# Patient Record
Sex: Female | Born: 1985 | Race: White | Hispanic: Yes | Marital: Single | Smoking: Never smoker
Health system: Southern US, Community
[De-identification: ages and names within clinical notes are randomized; demographics above are authoritative.]

## PROBLEM LIST (undated history)

## (undated) DIAGNOSIS — D649 Anemia, unspecified: Secondary | ICD-10-CM

## (undated) DIAGNOSIS — R87612 Low grade squamous intraepithelial lesion on cytologic smear of cervix (LGSIL): Secondary | ICD-10-CM

---
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
## 2004-04-13 ENCOUNTER — Ambulatory Visit: Admission: RE | Admit: 2004-04-13 | Discharge: 2004-04-13 | Payer: Self-pay | Admitting: Family Medicine

## 2004-04-13 ENCOUNTER — Emergency Department (HOSPITAL_COMMUNITY): Admission: EM | Admit: 2004-04-13 | Discharge: 2004-04-13 | Payer: Self-pay | Admitting: Emergency Medicine

## 2004-06-13 ENCOUNTER — Ambulatory Visit (HOSPITAL_COMMUNITY): Admission: RE | Admit: 2004-06-13 | Discharge: 2004-06-13 | Payer: Self-pay | Admitting: *Deleted

## 2004-11-12 ENCOUNTER — Ambulatory Visit: Payer: Self-pay | Admitting: *Deleted

## 2004-11-12 ENCOUNTER — Inpatient Hospital Stay (HOSPITAL_COMMUNITY): Admission: AD | Admit: 2004-11-12 | Discharge: 2004-11-14 | Payer: Self-pay | Admitting: *Deleted

## 2006-06-21 IMAGING — US US OB TRANSVAGINAL MODIFY
1 series · 14 of 28 positions shown · non-contrast
Comparison: none

CLINICAL DATA: Pain and bleeding x 1 week.  Positive pregnancy test.
 TRANSABDOMINAL AND TRANSVAGINAL PELVIC OB ULTRASOUND <14 WEEKS ? 04/13/04: 
 There is an intrauterine gestational sac present with a crown-rump length of 31.4 millimeters consistent with a 10 week 0 day gestation.  A regular fetal heart rate of 178 beats per minute is noted.  There is a small to moderate subchorionic hemorrhage present.  The ovaries are normal in size with a 2.2 cm in size hypoechoic area seen associated with the right ovary most likely representing a mildly complicated cyst (most likely a corpus luteum cyst).  There is no free pelvic fluid.

[Series 1: unknown · 0.32mm/px · 14 of 76 slices shown]
[im 3/76]
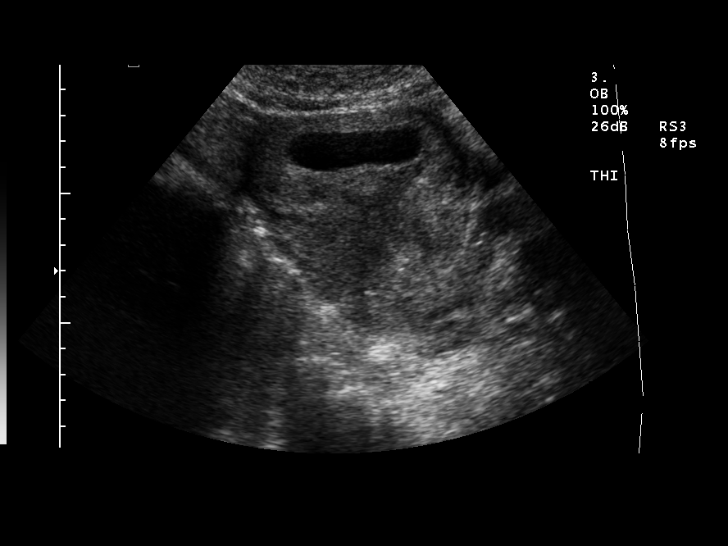
[im 9/76]
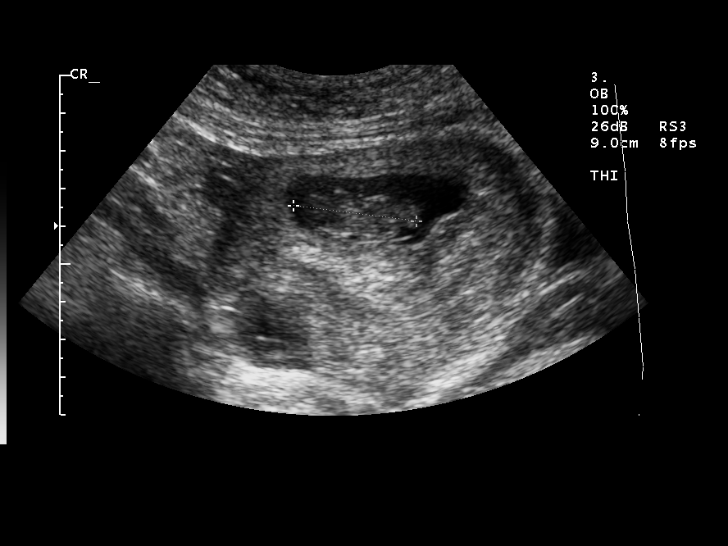
[im 14/76]
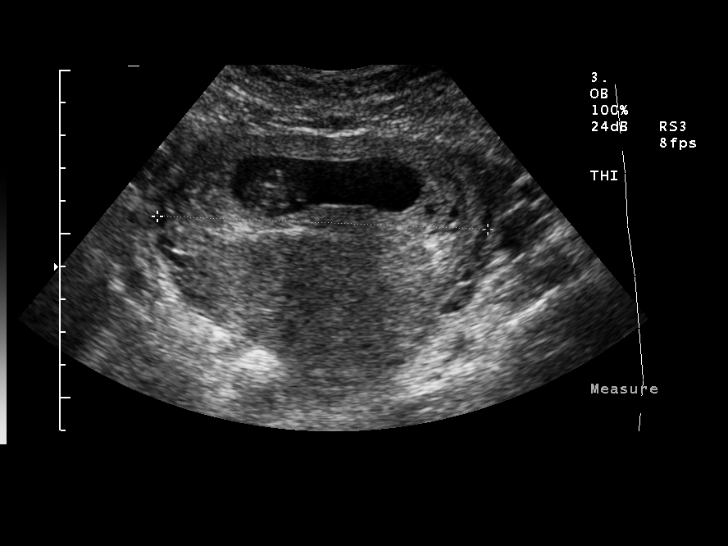
[im 20/76]
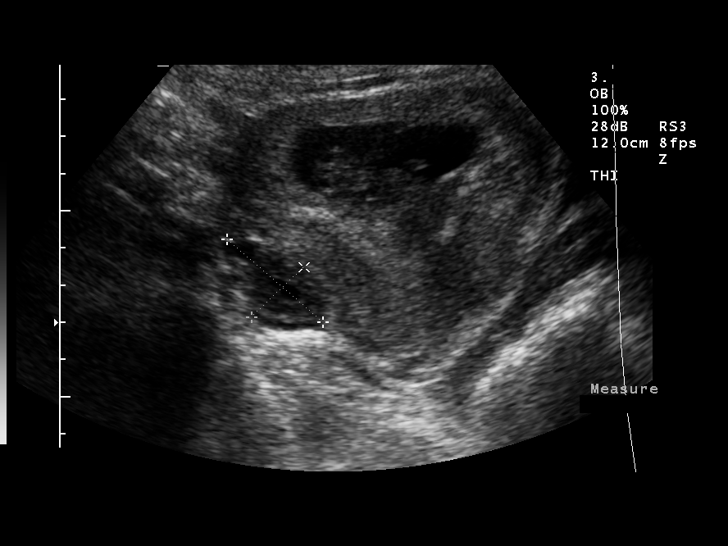
[im 26/76]
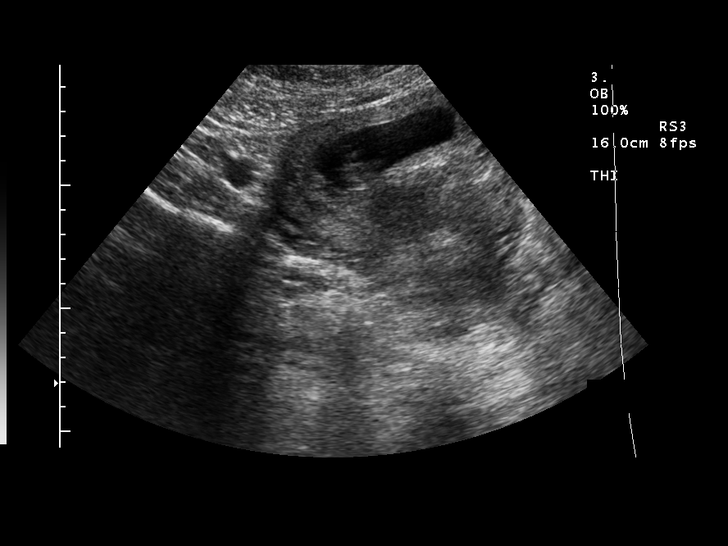
[im 31/76]
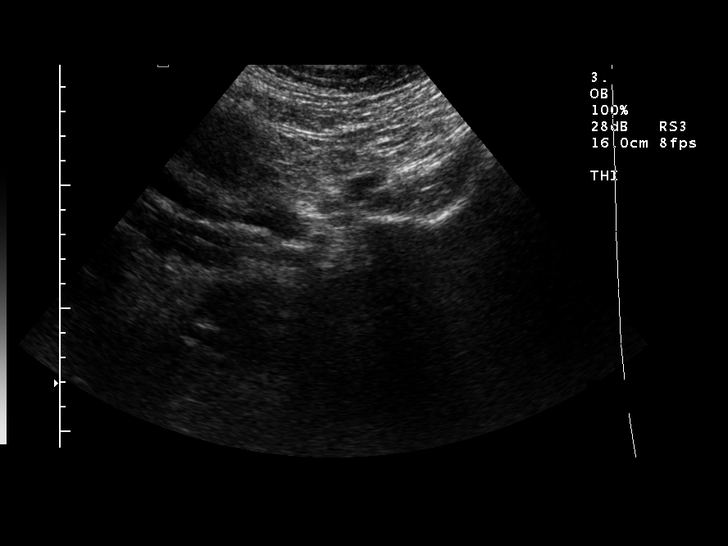
[im 37/76]
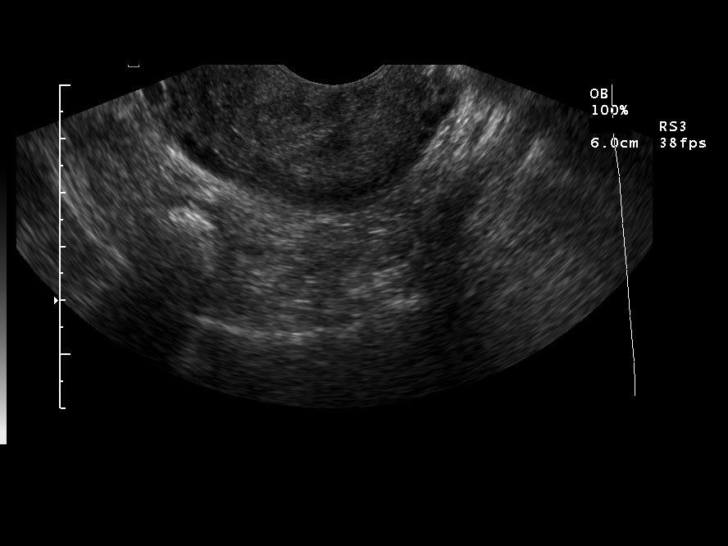
[im 42/76]
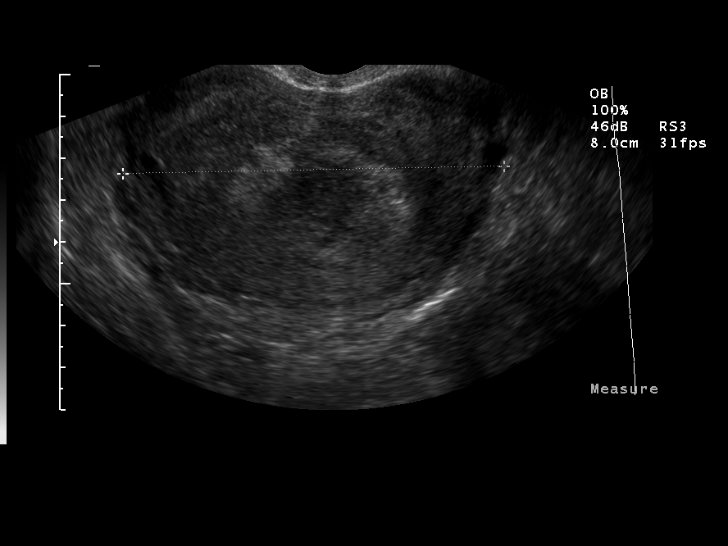
[im 48/76]
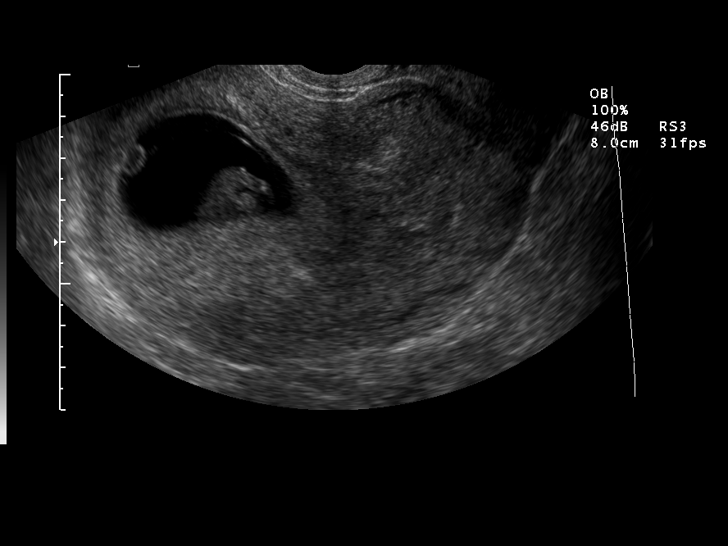
[im 53/76]
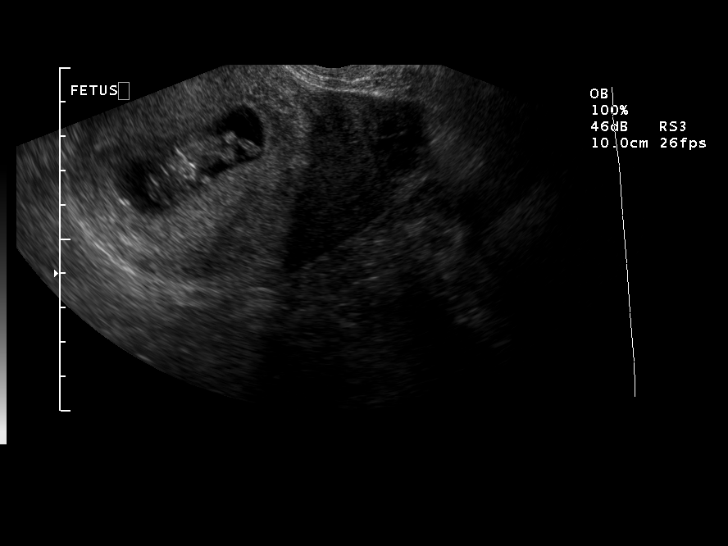
[im 59/76]
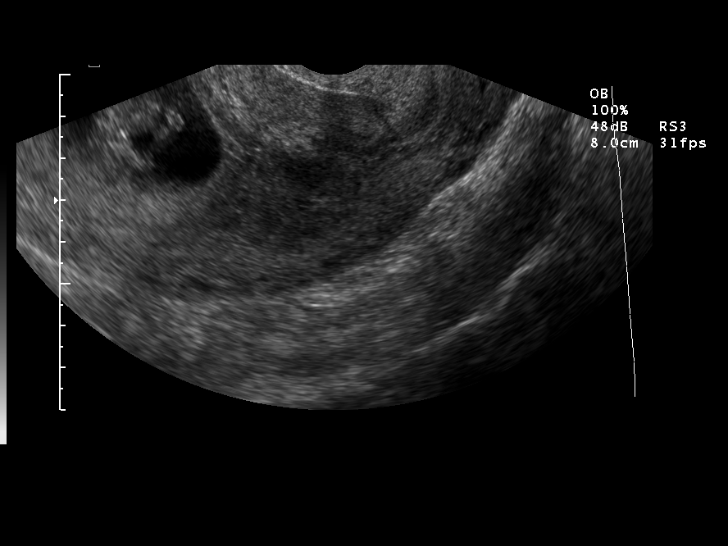
[im 64/76]
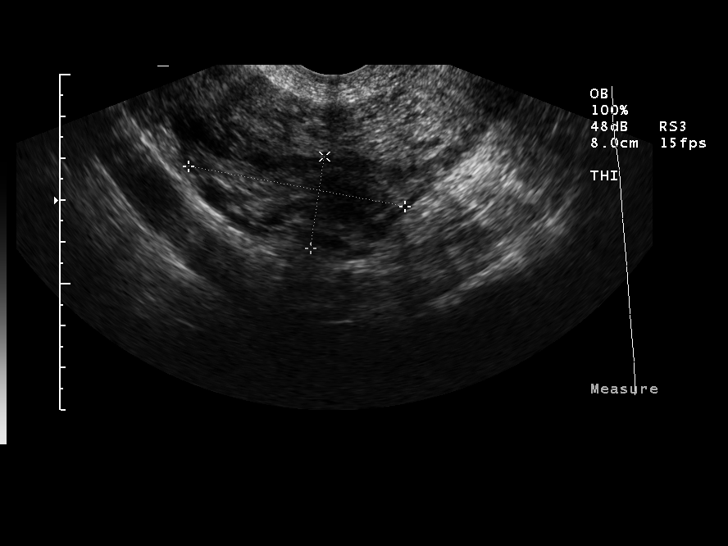
[im 70/76]
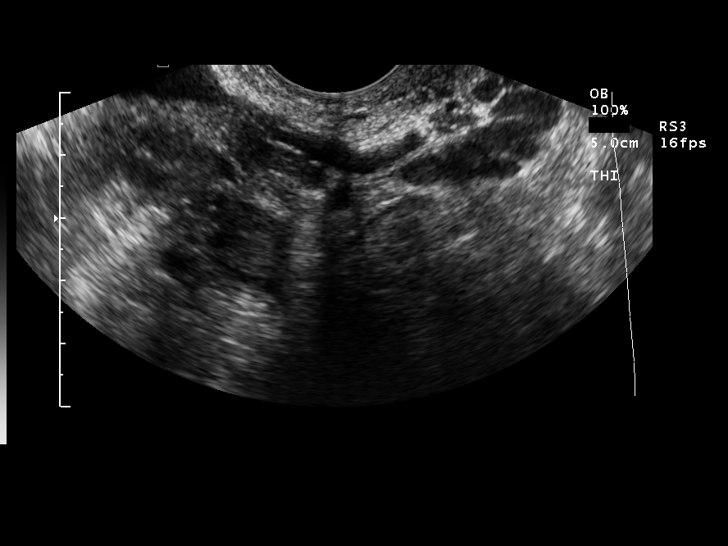
[im 76/76]
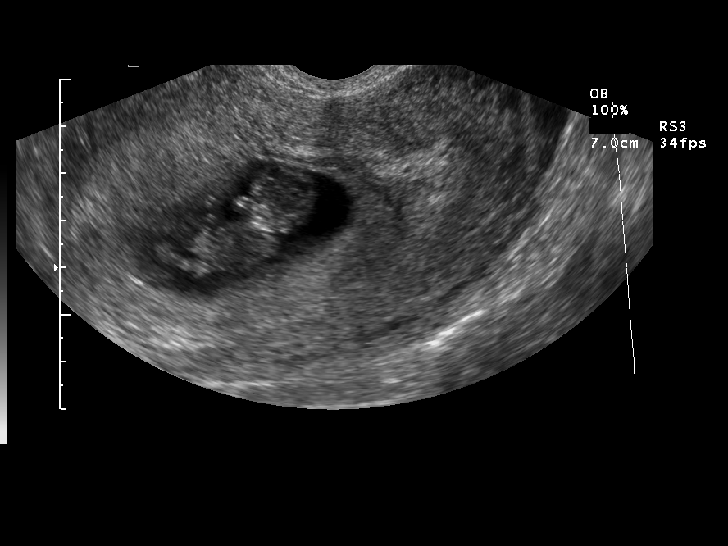

[14 of 28 positions shown; findings below may reference images not displayed]

IMPRESSION: There is a single living intrauterine gestation with a crown-rump length of 31.4 millimeters consistent with a 10 week 0 day gestation.  
 Small to moderate amount of subchorionic hemorrhage is noted.

## 2006-08-21 IMAGING — US US OB COMP +14 WK
1 series · 13 of 28 positions shown · non-contrast
Comparison: 04/13/04.

CLINICAL DATA: Evaluate anatomy.

 OBSTETRICAL ULTRASOUND:

[Series 1: us ob comp +14 wk · 0.12mm/px · 13 of 75 slices shown]
[im 3/75]
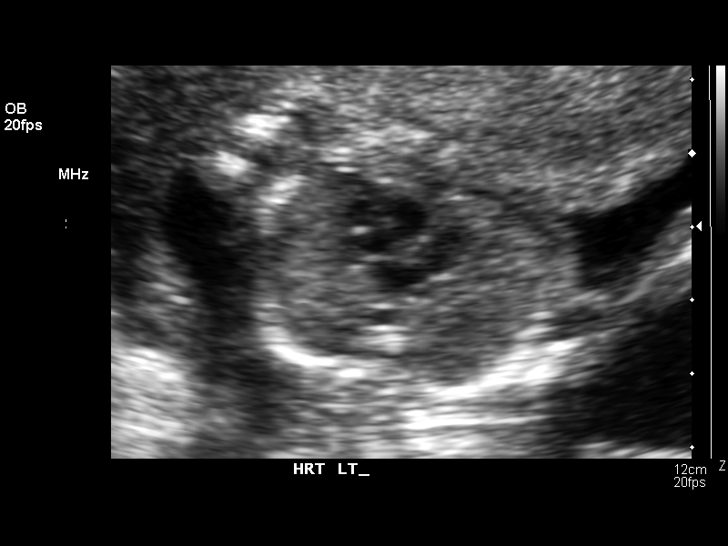
[im 9/75]
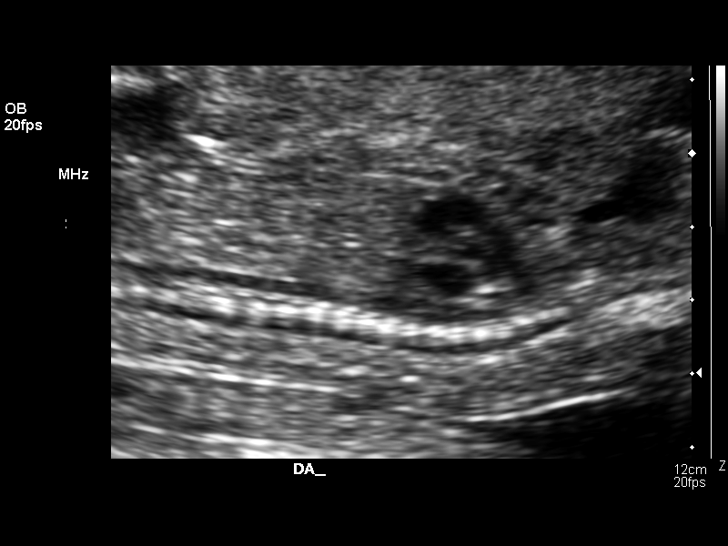
[im 14/75]
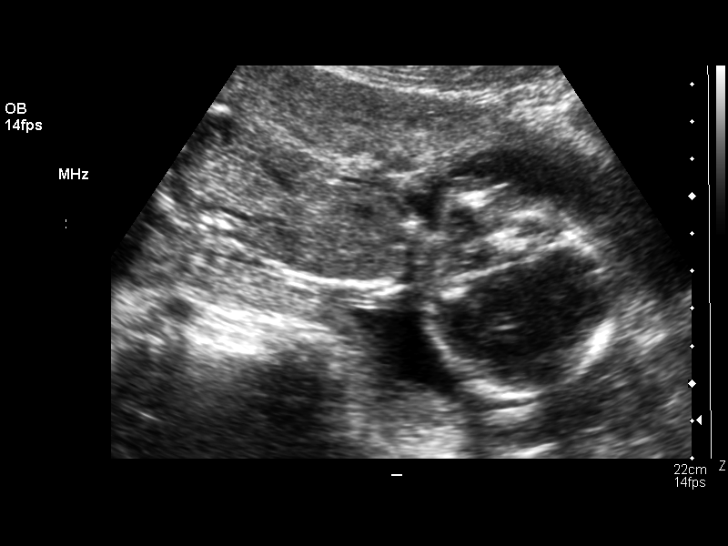
[im 20/75]
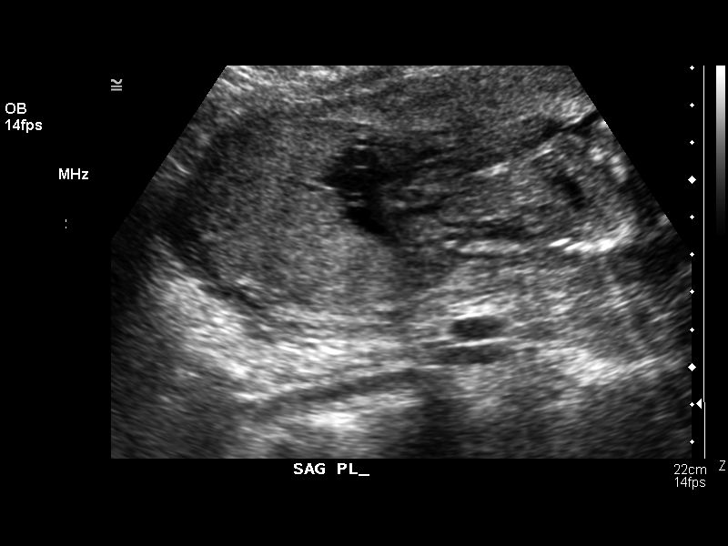
[im 25/75]
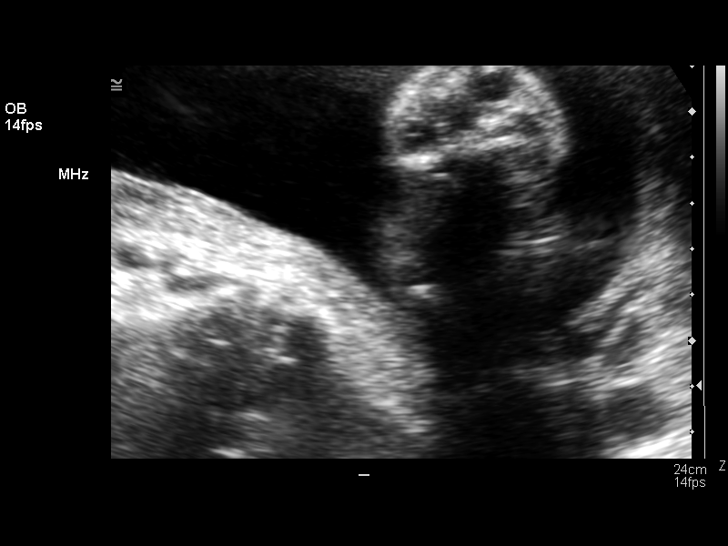
[im 31/75]
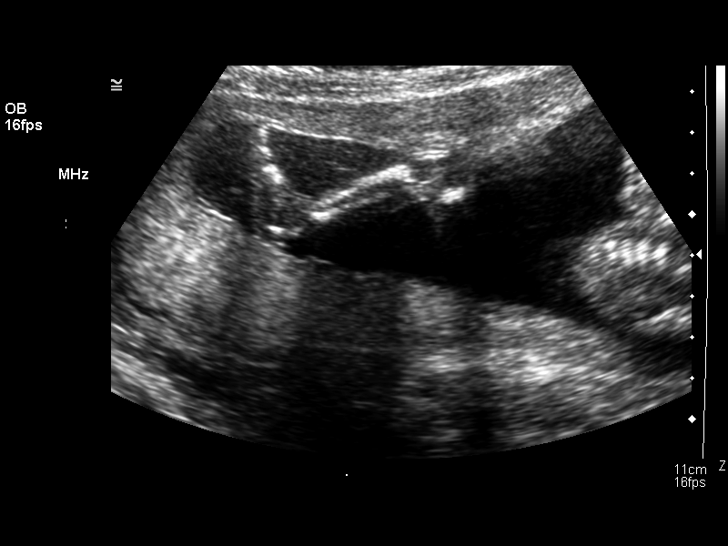
[im 39/75]
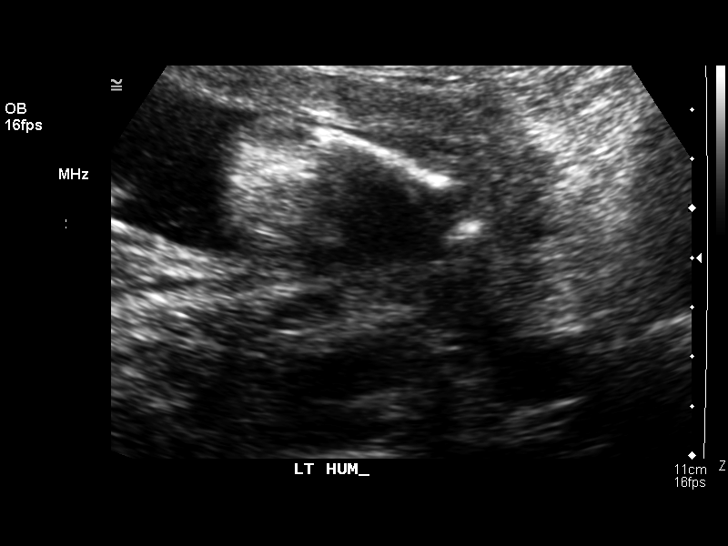
[im 44/75]
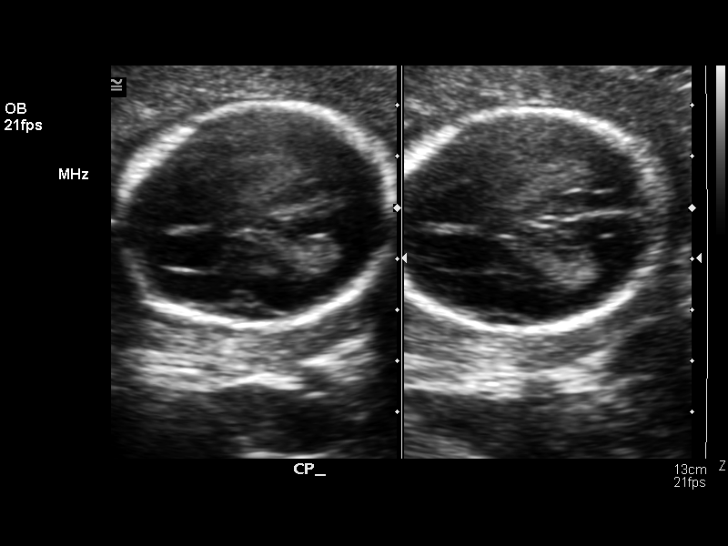
[im 50/75]
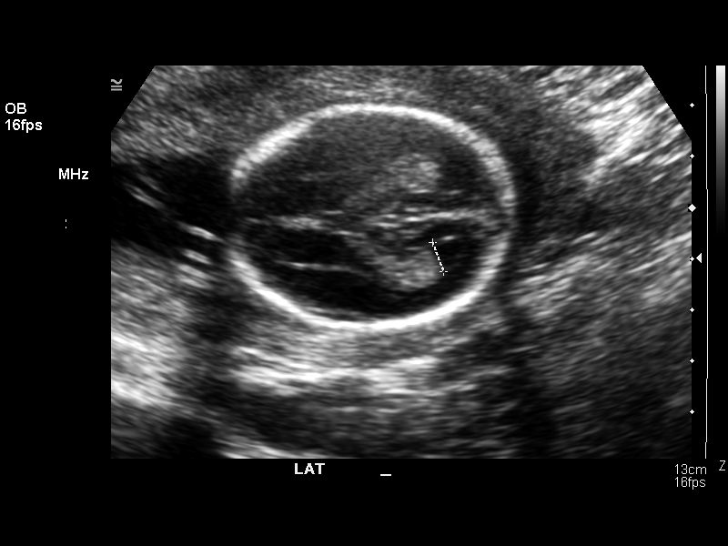
[im 55/75]
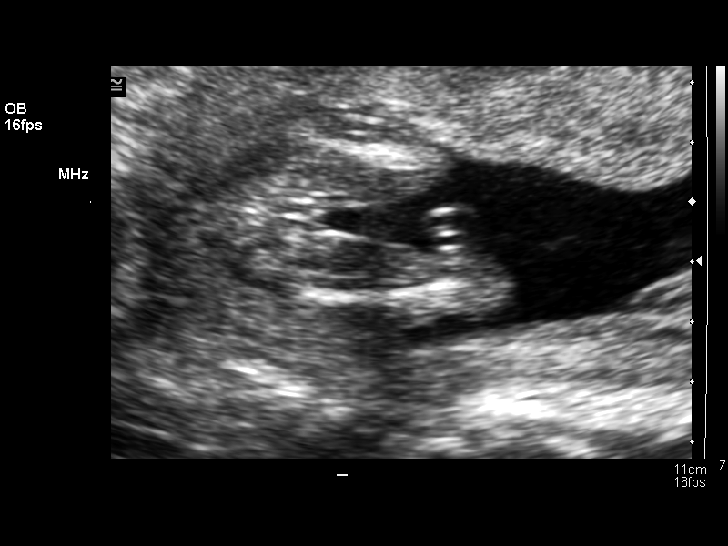
[im 61/75]
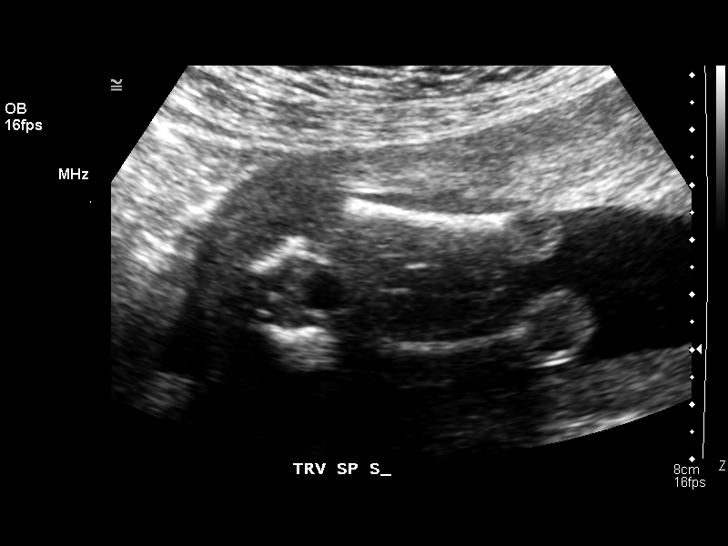
[im 66/75]
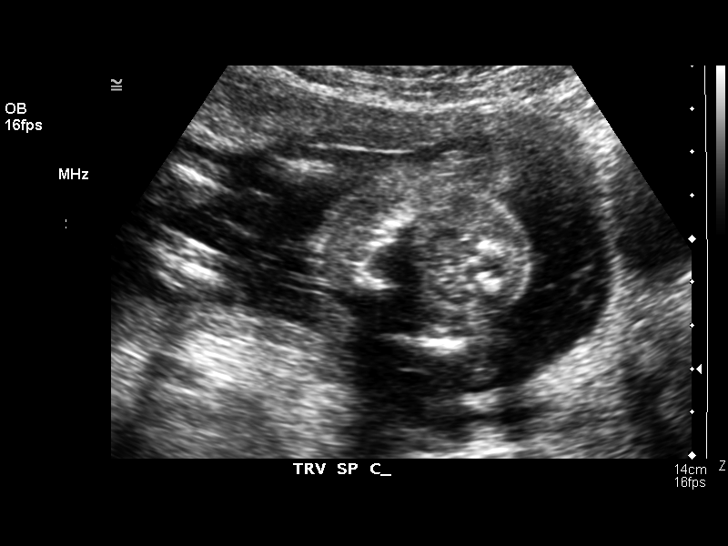
[im 72/75]
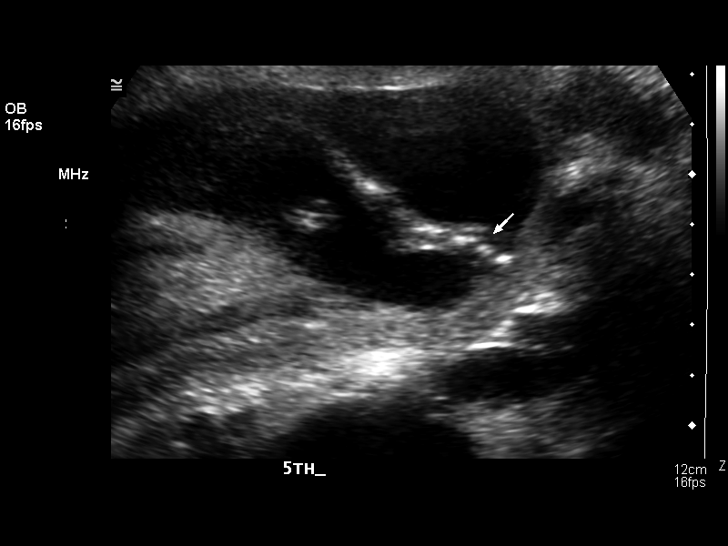

[13 of 28 positions shown; findings below may reference images not displayed]

Number of Fetuses: 1
 Heart Rate:  not recorded
 Movement: yes
 Breathing:  no       
 Presentation: cephalic
 Placental Location: fundal and posterior
 Grade: I
 Previa: no
 Amniotic Fluid (Subjective):  normal
 Amniotic Fluid (Objective): 4.4 cm vertical pocket

 FETAL BIOMETRY
 BPD:  4.3 cm ? 18 w 6 d   
 HC: 15.9 cm ? 18 w 6 d   
 AC: 14.0 cm ? 19 w 3 d  
 FL: 2.9 cm ? 18 w 6 d 

 MEAN GA:  19 w 0 d 

 FETAL ANATOMY
 Lateral Ventricles: visualized   
 Thalami/CSP: visualized     
 Posterior Fossa: visualized   
 Nuchal Region: visualized   
 Spine:  not visualized     
 4 Chamber Heart on Left:  visualized     
 Stomach on Left: visualized     
 3 Vessel Cord: visualized   
 Cord Insertion site: visualized   
 Kidneys: visualized   
 Bladder: visualized   
 Extremities: visualized     

 ADDITIONAL ANATOMY VISUALIZED: LVOT, RVOT, upper lip, orbits, diaphragm, heel, 5th digit, ductal arch, and aortic arch.

 Evaluation limited by: fetal position

 MATERNAL UTERINE AND ADNEXAL FINDINGS
 Cervix: 3.7 cm transabdominally
IMPRESSION: 1.  Single living intrauterine fetus in cephalic presentation with subjectively normal amniotic fluid volume. Interval growth has been appropriate with an estimated mean gestational age of 19 weeks 0 days by ultrasound on today?s study.
 2.  The visualized fetal anatomy is unremarkable although complete imaging of the spine could not be obtained secondary to fetal position.  The posterior fossa does have normal imaging characteristics.  If indicated, follow-up ultrasound in 3 to 4 weeks could be performed to reassess the fetal spine and assessment of fetal growth could be reassessed at that time as well.

 </u12:p>

## 2011-09-11 ENCOUNTER — Other Ambulatory Visit: Payer: Self-pay

## 2011-09-11 ENCOUNTER — Emergency Department (HOSPITAL_COMMUNITY)
Admission: EM | Admit: 2011-09-11 | Discharge: 2011-09-12 | Disposition: A | Discharge status: Home / Self Care | Payer: Self-pay | Attending: Emergency Medicine | Admitting: Emergency Medicine

## 2011-09-11 ENCOUNTER — Emergency Department (HOSPITAL_COMMUNITY): Payer: Self-pay

## 2011-09-11 ENCOUNTER — Encounter (HOSPITAL_COMMUNITY): Payer: Self-pay | Admitting: *Deleted

## 2011-09-11 DIAGNOSIS — J189 Pneumonia, unspecified organism: Secondary | ICD-10-CM | POA: Insufficient documentation

## 2011-09-11 DIAGNOSIS — R0602 Shortness of breath: Secondary | ICD-10-CM | POA: Insufficient documentation

## 2011-09-11 DIAGNOSIS — R079 Chest pain, unspecified: Secondary | ICD-10-CM | POA: Insufficient documentation

## 2011-09-11 NOTE — ED Notes (Signed)
She has had a cold for 2 days with coughing chest pain and some difficulty breathing.  No distress at present

## 2011-09-12 MED ORDER — ALBUTEROL SULFATE (5 MG/ML) 0.5% IN NEBU
5.0000 mg | INHALATION_SOLUTION | Freq: Once | RESPIRATORY_TRACT | Status: AC
  Administered 2011-09-12: 5 mg via RESPIRATORY_TRACT
  Filled 2011-09-12: qty 0.5

## 2011-09-12 MED ORDER — ACETAMINOPHEN-CODEINE 120-12 MG/5ML PO SOLN
10.0000 mL | Freq: Once | ORAL | Status: AC
  Administered 2011-09-12: 10 mL via ORAL
  Filled 2011-09-12: qty 10

## 2011-09-12 MED ORDER — IPRATROPIUM BROMIDE 0.02 % IN SOLN
0.5000 mg | Freq: Once | RESPIRATORY_TRACT | Status: AC
  Administered 2011-09-12: 0.5 mg via RESPIRATORY_TRACT
  Filled 2011-09-12: qty 2.5

## 2011-09-12 MED ORDER — ALBUTEROL SULFATE HFA 108 (90 BASE) MCG/ACT IN AERS
2.0000 | INHALATION_SPRAY | Freq: Once | RESPIRATORY_TRACT | Status: DC
  Filled 2011-09-12: qty 6.7

## 2011-09-12 MED ORDER — AZITHROMYCIN 250 MG PO TABS
500.0000 mg | ORAL_TABLET | Freq: Once | ORAL | Status: AC
  Administered 2011-09-12: 500 mg via ORAL
  Filled 2011-09-12: qty 2

## 2011-09-12 MED ORDER — AZITHROMYCIN 250 MG PO TABS: ORAL_TABLET | ORAL | Status: AC

## 2011-09-12 MED ORDER — ACETAMINOPHEN-CODEINE 120-12 MG/5ML PO SOLN: 10.0000 mL | Freq: Four times a day (QID) | ORAL | Status: AC | PRN

## 2011-09-12 NOTE — ED Provider Notes (Signed)
Medical screening examination/treatment/procedure(s) were performed by non-physician practitioner and as supervising physician I was immediately available for consultation/collaboration.  Jayson Waterhouse, MD 09/12/11 0703 

## 2011-09-12 NOTE — ED Provider Notes (Signed)
History     CSN: [id number]  Arrival date & time 09/11/11  2023   First MD Initiated Contact with Patient 09/11/11 2348      Chief Complaint  Patient presents with  . Shortness of Breath    (Consider location/radiation/quality/duration/timing/severity/associated sxs/prior treatment) HPI Comments: Patient here with cough, chest pain, congestion with green and yellow sputum, runny nose over the past 2 days - reports fever without chills, reports mild shortness of breath as well - states pain is worse with cough and palpation of her chest - denies nausea, vomiting abdominal pain.  Patient is a 26 y.o. female presenting with shortness of breath. The history is provided by the patient and the spouse. The history is limited by a language barrier. A language interpreter was used.  Shortness of Breath  The current episode started 2 days ago. The onset was gradual. The problem has been unchanged. The symptoms are relieved by nothing. The symptoms are aggravated by nothing. Associated symptoms include chest pain, a fever, rhinorrhea, cough, shortness of breath and wheezing. Pertinent negatives include no chest pressure, no orthopnea, no sore throat and no stridor. There was no intake of a foreign body. She was not exposed to toxic fumes. She has not inhaled smoke recently. She has had no prior steroid use. She has had no prior hospitalizations. She has had no prior ICU admissions. She has had no prior intubations. Urine output has been normal. The last void occurred less than 6 hours ago. There were no sick contacts.    History reviewed. No pertinent past medical history.  History reviewed. No pertinent past surgical history.  No family history on file.  History  Substance Use Topics  . Smoking status: Never Smoker   . Smokeless tobacco: Not on file  . Alcohol Use: No    OB History    Grav Para Term Preterm Abortions TAB SAB Ect Mult Living                  Review of Systems    Constitutional: Positive for fever.  HENT: Positive for rhinorrhea. Negative for sore throat.   Respiratory: Positive for cough, shortness of breath and wheezing. Negative for stridor.   Cardiovascular: Positive for chest pain. Negative for orthopnea.  All other systems reviewed and are negative.    Allergies  Review of patient's allergies indicates no known allergies.  Home Medications   Current Outpatient Rx  Name Route Sig Dispense Refill  . ALKA-SELTZER PO Oral Take 1-2 tablets by mouth every 4 (four) hours as needed. For cold like symptoms.      BP 127/81  Pulse 116  Temp(Src) 100.7 F (38.2 C) (Oral)  Resp 20  SpO2 100%  LMP 09/11/2011  Physical Exam  Nursing note and vitals reviewed. Constitutional: She is oriented to person, place, and time. She appears well-developed and well-nourished. No distress.  HENT:  Head: Normocephalic and atraumatic.  Right Ear: External ear normal.  Left Ear: External ear normal.  Mouth/Throat: Oropharynx is clear and moist.       Boggy nasal mucosa  Eyes: Conjunctivae are normal. Pupils are equal, round, and reactive to light. No scleral icterus.  Neck: Normal range of motion. Neck supple.  Cardiovascular: Regular rhythm and normal heart sounds.  Exam reveals no gallop and no friction rub.   No murmur heard.      tachycardia  Pulmonary/Chest: Effort normal. No respiratory distress. She has wheezes. She has no rales. She exhibits tenderness.  Bilateral expiratory wheezing noted to upper and mid lung fields.  Abdominal: Soft. Bowel sounds are normal. She exhibits no distension. There is no tenderness.  Musculoskeletal: Normal range of motion. She exhibits no edema and no tenderness.  Lymphadenopathy:    She has no cervical adenopathy.  Neurological: She is alert and oriented to person, place, and time. No cranial nerve deficit.  Skin: Skin is warm and dry. No rash noted. No erythema. No pallor.  Psychiatric: She has a normal  mood and affect. Her behavior is normal. Judgment and thought content normal.    ED Course  Procedures (including critical care time)  Labs Reviewed - No data to display Dg Chest 2 View  09/11/2011  *RADIOLOGY REPORT*  Clinical Data: Shortness of breath with cough and congestion 3 days, nonsmoker.  CHEST - 2 VIEW  Comparison: None.  Findings: Increased markings left lower lobe suggestive of patchy pneumonia.  No effusion or pneumothorax.  Clear right hemithorax. Normal heart size.  No bony abnormality.  IMPRESSION: Early left lower lobe pneumonia.  Original Report Authenticated By: Elsie Stain, M.D.    Date: 09/12/2011  Rate: 121  Rhythm: sinus tachycardia  QRS Axis: normal  Intervals: normal  ST/T Wave abnormalities: nonspecific T wave changes  Conduction Disutrbances:none  Narrative Interpretation: Reviewed by Dr. Jeraldine Loots  Old EKG Reviewed: none available   CAP     MDM  Patient with two day history of fever, cough and shortness of breath with chest x-ray that shows early LLL  PNA - I have started treatment for this - states she feels better - though PE could be in the differential, I doubt this due to the fever and cough.  She will resume treatment and follow up with PCP this coming week.        Izola Price Yaak, [place] 09/12/11 [phone]

## 2011-09-12 NOTE — Discharge Instructions (Signed)
Neumona en el adulto (Pneumonia, Adult) La neumona es una infeccin en los pulmones.  CAUSAS La causa puede ser un virus o una bacteria. Generalmente estas infecciones se producen por la aspiracin de partculas infecciosas que ingresan a los pulmones (tracto respiratorio). SNTOMAS  Tos.   Grant Ruts.   Dolor en el pecho.   Frecuencia respiratoria aumentada.   Dificultad respiratoria.   Produccin de mucosidad.  DIAGNSTICO  Si presenta los sntomas comunes de la neumona, normalmente el mdico confirmar el diagnstico con una radiografa de trax. La radiografa mostrar la anormalidad del pulmn (infiltrados pulmonares) si tiene neumona. Podrn realizarse otras pruebas de [place], [place] o esputo para encontrar la causa especfica de su neumona. El profesional que lo Designer, industrial/product pruebas (como gases en sangre o una oximetra de pulso) para Loss adjuster, chartered correcto funcionamiento de los pulmones. TRATAMIENTO Algunos tipos de neumona pueden contagiarse a otras personas al toser o Engineering geologist. Es posible que le pidan que utilice una mscara antes y Programmer, applications. Si la neumona es causada por una bacteria, puede tratarse con medicamentos antibiticos. Si la neumona es causada por el virus de la influenza, puede tratarse con medicamentos antivirales. La [employer] de las dems infecciones virales deben seguir su curso. Estas infecciones no respondern a los antibiticos.  PREVENCIN La vacuna antineumocccica est disponible para prevenir la neumona bacteriana comn. Generalmente se aconseja su aplicacin en:  Personas mayores de 65 aos de edad.   Pacientes que estn en tratamiento de quimioterapia.   Personas con trastornos pulmonares crnicos, como bronquitis o enfisema.   Personas con problemas del sistema inmunolgico.  Si usted es mayor de 65 aos de edad o tiene un trastorno que lo pone en situacin de alto riesgo, es posible que reciba una vacuna antineumoccica. En  algunos pases, tambin se recomienda la aplicacin de rutina de la vacuna contra la influenza. Esta vacuna puede ayudar a prevenir algunos casos de neumona.Es posible que le ofrezcan aplicarse la vacuna contra la influenza como parte del [place].  Si es fumador, es el momento de abandonar el hbito. Podr recibir instrucciones para facilitarle la decisin de dejar de fumar. Los profesionales pueden proporcionarle medicamentos y asesoramiento para ayudarlo. INSTRUCCIONES PARA EL CUIDADO DOMICILIARIO  Puede utilizar antitusivos si no puede descansar bien; sin embargo, la tos ayuda a [employer]. Debe evitar utilizar medicamentos para detener la tos, si puede.   Es posible que el mdico le haya prescrito medicamentos si cree que su neumona es causada por una bacteria o por la influenza. Finalice la prescripcin completa, aunque comience a sentirse mejor.   El profesional tambin puede recomendarle o prescribirle un expectorante para aflojar la mucosidad y eliminarla con la tos.   Utilice los medicamentos de venta libre o de prescripcin para Chief Technology Officer, Environmental health practitioner o la [place], segn se lo indique el profesional que lo asiste.   No fume. El fumar es una de las causas ms frecuentes de bronquitis y puede contribuir a la neumona. Si es fumador y Conservator, museum/gallery, la tos puede durar varias semanas despus que la neumona haya desaparecido.   Un vaporizador o humidificador con vapor fro en la habitacin o en la casa pueden ayudar a aflojar la mucosidad.   La tos generalmente empeora por la noche. Duerma en posicin semisentada en Loni Dolly o use un par de almohadas debajo de la cabeza.   Haga reposo todo el tiempo que necesite. El [employer] har saber cuando tiene que descansar.  SOLICITE ATENCIN MDICA  DE INMEDIATO SI:  La enfermedad empeora. Esto vale especialmente en el caso de que usted sea una persona mayor o se encuentre dbil por otra enfermedad.   No puede controlar  la tos con medicamentos y no puede dormir debido a Secretary/administrator.   Comienza a escupir sangre al toser.   Presenta dolor que empeora o no puede controlar con los medicamentos.   Tiene fiebre.   Si alguno de los sntomas que lo trajeron a Stage manager en vez de Scientist, clinical (histocompatibility and immunogenetics).   Siente falta de aire o Journalist, newspaper.  EST SEGURO QUE:   Comprende las instrucciones para el alta mdica.   Controlar su enfermedad.   Solicitar atencin mdica de inmediato segn las indicaciones.  Document Released: 01/31/2005 Document Revised: 04/12/2011 [facility] Patient Information 2012 [place], [place].

## 2016-05-09 DIAGNOSIS — R87612 Low grade squamous intraepithelial lesion on cytologic smear of cervix (LGSIL): Secondary | ICD-10-CM

## 2016-05-09 HISTORY — DX: Low grade squamous intraepithelial lesion on cytologic smear of cervix (LGSIL): R87.612

## 2016-06-20 ENCOUNTER — Encounter (HOSPITAL_COMMUNITY): Payer: Self-pay | Admitting: *Deleted

## 2016-06-21 ENCOUNTER — Ambulatory Visit (HOSPITAL_COMMUNITY): Payer: Self-pay

## 2016-07-04 ENCOUNTER — Encounter: Payer: Self-pay | Admitting: Medical

## 2016-07-18 ENCOUNTER — Ambulatory Visit (INDEPENDENT_AMBULATORY_CARE_PROVIDER_SITE_OTHER): Payer: Self-pay | Admitting: Physician Assistant

## 2016-07-18 ENCOUNTER — Encounter (INDEPENDENT_AMBULATORY_CARE_PROVIDER_SITE_OTHER): Payer: Self-pay | Admitting: Physician Assistant

## 2016-07-18 VITALS — BP 124/89 | HR 75 | Temp 98.0°F | Ht 65.5 in | Wt 218.6 lb

## 2016-07-18 DIAGNOSIS — R87612 Low grade squamous intraepithelial lesion on cytologic smear of cervix (LGSIL): Secondary | ICD-10-CM

## 2016-07-18 DIAGNOSIS — N879 Dysplasia of cervix uteri, unspecified: Secondary | ICD-10-CM | POA: Insufficient documentation

## 2016-07-18 NOTE — Patient Instructions (Signed)
Colposcopia (Colposcopy) La colposcopia es un procedimiento para examinar la parte inferior del tero (cuello del tero), la vagina, la zona que rodea la abertura vaginal (vulva) para identificar anormalidades o signos de enfermedad. Para realizar este procedimiento se utiliza un microscopio con luz o una lupa (colposcopio). Si se encuentran clulas inusuales durante el procedimiento, el mdico puede extraer Lauris Poaguna muestra de tejido para examinarla (biopsia). Se puede realizar una colposcopia si:  Tiene un Papanicolaou anormal. Un Papanicolaou es una prueba de deteccin que se utiliza para Engineer, manufacturingdetectar signos de cncer o infeccin en la vagina, el cuello del tero y Careers information officerel tero.  Tiene un Papanicolaou con resultado positivo de VPH de alto riesgo (virus del papiloma humano).  Si tiene [place] llaga o una lesin en el cuello del tero.  Tiene verrugas genitales en la vulva, la vagina o el cuello del tero.  Tom ciertos medicamentos durante el embarazo, como dietiletilbestrol (DES).  Siente dolor durante las The St. Paul Travelersrelaciones sexuales.  Tiene hemorragias vaginales, especialmente despus de Sales promotion account executivemantener relaciones sexuales.  Tienen que extraerle un plipo del cuello del tero.  Tienen que encontrarle un hilo perdido del dispositivo intrauterino (DIU). INFORME A SU MDICO:  Cualquier alergia que tenga, incluidas las alergias a medicamentos recetados, al ltex o al yodo.  Todos los Walt Disneymedicamentos que utiliza, incluidos vitaminas, hierbas, gotas oftlmicas, cremas y [address]medicamentos de [address] Northventa libre. Lleve una lista de todos sus medicamentos a la Violaconsulta.  Cualquier problema previo que usted o los miembros de su familia hayan tenido con anestsicos.  Enfermedades de la sangre que tenga.  Cirugas previas.  Cualquier afeccin mdica que tenga, como enfermedad plvica inflamatoria (EPI) o trastorno endometrial.  Algn antecedente de desmayos frecuentes.  Su ciclo menstrual y el mtodo de control de la natalidad  (anticonceptivo) que Chicagoutiliza.  Su historia clnica, incluido cualquier tratamiento previo en el cuello del tero.  Si est embarazada o podra estarlo. RIESGOS Y COMPLICACIONES En general, se trata de un procedimiento seguro. Sin embargo, pueden ocurrir complicaciones, por ejemplo:  Engineer, miningDolor.  Infeccin, que puede incluir fiebre, secrecin con mal olor o dolor plvico.  Hemorragia o secrecin.  Diagnstico errneo.  Desmayos y Architectural technologistreacciones vasovagales, pero esto es poco frecuente.  Reacciones alrgicas a los medicamentos.  Daos a Systems developerotras estructuras u otros rganos. ANTES DEL PROCEDIMIENTO  Si tiene el perodo menstrual o lo tendr en el momento del procedimiento, dgaselo al mdico. En general, la colposcopia no se realiza Environmental consultantdurante la menstruacin.  Contine con sus prcticas anticonceptivas antes y despus del procedimiento.  Durante las 24horas previas a la colposcopia:  No se haga duchas vaginales.  No use tampones.  No se aplique medicamentos, cremas o supositorios en la vagina.  No tenga relaciones sexuales.  Consulte al mdico si debe hacer o no lo siguiente:  Cambiar o suspender los medicamentos que toma habitualmente. Esto es muy importante si toma medicamentos para la diabetes o anticoagulantes.  Tomar medicamentos como aspirina e ibuprofeno. Estos medicamentos pueden tener un efecto anticoagulante en la Unionsangre. No tome estos medicamentos antes del procedimiento si el mdico le indica que no lo haga. Es probable que Public affairs consultantel mdico le diga que evite tomar aspirina o medicamentos que contienen aspirina durante 7das antes del procedimiento.  Siga las indicaciones del mdico respecto de las restricciones para las comidas o las bebidas. Probablemente deba seguir una dieta normal el da del procedimiento y no omitir Patent examinerninguna comida.  Pueden hacerle un examen o estudios. Se realizar una prueba de Charter Communicationsembarazo el da del procedimiento.  Pueden  extraerle Lauris Poaguna muestra de Sum[place]angre o  pedirle [place]una muestra de Partridgeorina.  Haga planes para que una persona la lleve a su casa despus del hospital o la clnica.  Si se ir a su casa inmediatamente despus del procedimiento, planifique que alguien se quede con usted durante 24horas. PROCEDIMIENTO  Estar acostada boca arriba, con los pies en los soportes (estribos).  Se introducir en la vagina un instrumento tibio y lubricado (espculo). El Reliant Energyespculo se usar para separar las paredes de la vagina de modo que el mdico pueda ver el cuello del tero y el interior de la vagina.  Se utilizar un hisopo para Scientific laboratory techniciancolocar una pequea cantidad de solucin lquida en las zonas que se examinarn. Esta solucin facilita la observacin de clulas anormales. Puede sentir un leve ardor en este momento.  Se usar el colposcopio para observar el cuello del tero con una luz blanca brillante. Se sostendr el colposcopio cerca de la vulva, y Electronics engineereste amplificar la vulva, la vagina y el cuello del tero para facilitar la observacin.  El mdico puede decidir si realiza una biopsia. En este caso:  Pueden administrarle un medicamento para adormecer la zona (anestesia local).  Se emplearn instrumentos quirrgicos para succionar mucosidad y clulas a travs de la vagina.  Puede sentir un dolor leve cuando le extraen la St. Marymuestra de tejido.  Puede haber hemorragia. Se puede usar una solucin para Comptrollerdetener la hemorragia.  Si se necesita Lauris Poaguna muestra de tejido del interior del cuello del tero, se puede realizar otro procedimiento, llamado curetaje endocervical (CEC). Durante este procedimiento, se usar un instrumento curvo (cureta) para raspar clulas del cuello del tero o de la parte superior del tero (endocervix).  El Engineer, civil (consulting)mdico registrar la ubicacin de cualquier anormalidad. Este procedimiento puede variar segn el mdico y el hospital. DESPUS DEL PROCEDIMIENTO  Estar recostada y har reposo durante unos minutos. Tal vez le ofrezcan jugo o galletas.  Le  controlarn la presin arterial, la frecuencia cardaca, la frecuencia respiratoria y Air cabin crewel nivel de oxgeno en la sangre hasta que haya desaparecido el efecto de los medicamentos administrados.  Quizs Huntsman Corporationdeba usar medias de compresin. Estas medias ayudan a Transport plannerevitar la formacin de cogulos sanguneos y a Building services engineerreducir la hinchazn de las piernas.  Puede tener calambres en el abdomen. Esto debe desaparecer despus de algunos minutos. Esta informacin no tiene Theme park managercomo fin reemplazar el consejo del mdico. Asegrese de hacerle al mdico cualquier pregunta que tenga. Document Released: 04/23/2005 Document Revised: 12/24/2012 Document Reviewed: 11/28/2015 Elsevier Interactive Patient Education  2017 Arvin[employer]itor[employer]vier Inc.

## 2016-07-18 NOTE — Progress Notes (Signed)
  Subjective:  Patient ID: Kristin Henry, female    DOB: 01/29/1986  Age: 31 y.o. MRN: [id number]  CC:  Abnormal PAP   HPI Kristin Flirtnahi Route is a 31 y.o. 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 She is here because she was unable to obtain an appointment to be seen by GYN. She would like to be referred to GYN for further evaluation and treatment. Has no GU/GI sxs, cardiovascular sxs, MSK sxs, or constitutional sxs.    Outpatient Medications Prior to Visit  Medication Sig Dispense Refill  . Aspirin Effervescent (ALKA-SELTZER PO) Take 1-2 tablets by mouth every 4 (four) hours as needed. For cold like symptoms.     No facility-administered medications prior to visit.      ROS Review of Systems  Constitutional: Negative for chills, fever and malaise/fatigue.  Eyes: Negative for blurred vision.  Respiratory: Negative for shortness of breath.   Cardiovascular: Negative for chest pain and palpitations.  Gastrointestinal: Negative for abdominal pain and nausea.  Genitourinary: Negative for dysuria and hematuria.  Musculoskeletal: Negative for joint pain and myalgias.  Skin: Negative for rash.  Neurological: Negative for tingling and headaches.  Psychiatric/Behavioral: Negative for depression. The patient is not nervous/anxious.     Objective:  BP 124/89 (BP Location: Left Arm, Patient Position: Sitting, Cuff Size: Normal)   Pulse 75   Temp 98 F (36.7 C) (Oral)   Ht 5' 5.5" (1.664 m)   Wt 218 lb 9.6 oz (99.2 kg)   LMP 07/09/2016 (Exact Date)   SpO2 98%   BMI 35.82 kg/m   BP/Weight 07/18/2016 09/12/2011  Systolic BP 124 116  Diastolic BP 89 74  Wt. (Lbs) 218.6 -  BMI 35.82 -      Physical Exam  Constitutional: She is oriented to person, place, and time.  NAD, overweight, reserved  HENT:  Head: Normocephalic and atraumatic.  Eyes: No scleral icterus.  Neck: No thyromegaly present.  Musculoskeletal: Normal range of motion.  Neurological: She  is alert and oriented to person, place, and time.  Skin: Skin is dry.  Psychiatric: She has a normal mood and affect. Her behavior is normal. Thought content normal.     Assessment & Plan:   1. LGSIL on Pap smear of cervix - Needs Colposcopy - Ambulatory referral to Gynecology     Follow-up: Return if symptoms worsen or fail to improve.   Loletta Specteroger David Gomez PA

## 2016-07-27 ENCOUNTER — Ambulatory Visit (INDEPENDENT_AMBULATORY_CARE_PROVIDER_SITE_OTHER): Payer: Self-pay

## 2016-08-17 ENCOUNTER — Ambulatory Visit (INDEPENDENT_AMBULATORY_CARE_PROVIDER_SITE_OTHER): Payer: Self-pay

## 2016-08-28 ENCOUNTER — Ambulatory Visit (HOSPITAL_COMMUNITY)
Admission: RE | Admit: 2016-08-28 | Discharge: 2016-08-28 | Disposition: A | Discharge status: Home / Self Care | Payer: Self-pay | Source: Ambulatory Visit | Attending: Obstetrics and Gynecology | Admitting: Obstetrics and Gynecology

## 2016-08-28 ENCOUNTER — Encounter (HOSPITAL_COMMUNITY): Payer: Self-pay

## 2016-08-28 VITALS — BP 118/82 | Ht 64.0 in | Wt 214.0 lb

## 2016-08-28 DIAGNOSIS — R87612 Low grade squamous intraepithelial lesion on cytologic smear of cervix (LGSIL): Secondary | ICD-10-CM

## 2016-08-28 DIAGNOSIS — Z1239 Encounter for other screening for malignant neoplasm of breast: Secondary | ICD-10-CM

## 2016-08-28 HISTORY — DX: Low grade squamous intraepithelial lesion on cytologic smear of cervix (LGSIL): R87.612

## 2016-08-28 HISTORY — DX: Anemia, unspecified: D64.9

## 2016-08-28 NOTE — Progress Notes (Signed)
Patient referred to [facility] by the [facility] Department due to having an abnormal Pap smear 05/09/2016 that a colposcopy is recommended for follow up.   Pap Smear: Pap smear not completed today. Last Pap smear was 05/09/2016 at the [facility] and [facility]. Patient referred to the [facility] for a colpscopy. Appointment scheduled for Friday, August 31, 2016 at 0820. Per patient has no history of abnormal Pap smears prior to the most recent Pap smear. Last Pap smear result is in EPIC.  Physical exam: Breasts Breasts symmetrical. No skin abnormalities bilateral breasts. No nipple retraction bilateral breasts. No nipple discharge bilateral breasts. No lymphadenopathy. No lumps palpated bilateral breasts. No complaints of pain or tenderness on exam. Screening mammogram recommended at age 17 unless clinically indicated prior.       Pelvic/Bimanual No Pap smear completed today since last Pap smear was 05/09/2016. Pap smear not indicated per [facility] guidelines.   Smoking History: Patient has never smoked.  Patient Navigation: Patient education provided. Access to services provided for patient through [facility] program. Spanish interpreter provided.  Used Spanish interpreter [employer] from [facility].

## 2016-08-28 NOTE — Patient Instructions (Signed)
Explained breast self awareness with [name]. Patient did not need a Pap smear today due to last Pap smear was 08/31/2016. Explained the colposcopy the recommended follow up for the abnormal Pap smear. Patient referred to the [facility] for a colpscopy. Appointment scheduled for Friday, August 31, 2016 at 0820. Patient aware of appointment and will be there. Let patient know that she will need a screening mammogram at age 13 unless clinically indicated prior. Kristin Henry verbalized understanding.  Eisa Necaise, Kathaleen Maser, RN @ 12:36 PM

## 2016-08-29 ENCOUNTER — Encounter (HOSPITAL_COMMUNITY): Payer: Self-pay | Admitting: *Deleted

## 2016-08-30 ENCOUNTER — Ambulatory Visit (INDEPENDENT_AMBULATORY_CARE_PROVIDER_SITE_OTHER): Payer: Self-pay | Admitting: Obstetrics and Gynecology

## 2016-08-30 ENCOUNTER — Encounter: Payer: Self-pay | Admitting: Obstetrics and Gynecology

## 2016-08-30 ENCOUNTER — Other Ambulatory Visit (HOSPITAL_COMMUNITY)
Admission: RE | Admit: 2016-08-30 | Discharge: 2016-08-30 | Disposition: A | Discharge status: Home / Self Care | Payer: Self-pay | Source: Ambulatory Visit | Attending: Obstetrics and Gynecology | Admitting: Obstetrics and Gynecology

## 2016-08-30 VITALS — BP 135/86 | HR 86 | Wt 218.5 lb

## 2016-08-30 DIAGNOSIS — Z789 Other specified health status: Secondary | ICD-10-CM

## 2016-08-30 DIAGNOSIS — Z3202 Encounter for pregnancy test, result negative: Secondary | ICD-10-CM

## 2016-08-30 DIAGNOSIS — R87612 Low grade squamous intraepithelial lesion on cytologic smear of cervix (LGSIL): Secondary | ICD-10-CM

## 2016-08-30 LAB — POCT PREGNANCY, URINE: Preg Test, Ur: NEGATIVE

## 2016-08-30 NOTE — Progress Notes (Addendum)
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 Inadequate colpo  Indications:  Patient not sure when last pap was. Last pregnancy in 2006.   Procedure Details  LMP 4/5; UPT negative. patient also denies any tobacco use or 2nd hand smoke exposure.   The risks (including infection, bleeding, pain) and benefits of the procedure were explained to the patient and written informed consent was obtained.  The patient was placed in the dorsal lithotomy position. A Graves was speculum inserted in the vagina, and the cervix was visualized.  AA staining done Lugol's with green filter.  Biopsy was done at 12 o'clock and then single toothed tenaculum applied and ECC in all four quadrants done. No bleeding after procedure after application of Monsel's  Findings: mild AWE changes seen around the TZ consistent with CIN 1. TZ not adequate but had AWE changes and hypervascularity  Adequate: no  Specimens: 12 o'clock and ECC  Condition: Stable  Complications: None  Plan: The patient was advised to call for any fever or for prolonged or severe pain or bleeding. She was advised to use OTC analgesics as needed for mild to moderate pain. Pelvic rest x 1 week  Based on findings, patient told likely will just need repeat pap in one year but will call her and finalize plan next week. Importance of surveillance paps d/w her.   Interpreter used   Bing, Montez Hageman MD Attending Center for [employer] [facility])

## 2016-08-31 ENCOUNTER — Ambulatory Visit: Payer: Self-pay | Admitting: Obstetrics & Gynecology

## 2016-09-06 ENCOUNTER — Encounter: Payer: Self-pay | Admitting: Obstetrics and Gynecology

## 2016-09-11 ENCOUNTER — Encounter: Payer: Self-pay | Admitting: *Deleted
